# Patient Record
Sex: Male | Born: 1935 | Hispanic: No | Marital: Married | Smoking: Never smoker
Health system: Southern US, Community
[De-identification: ages and names within clinical notes are randomized; demographics above are authoritative.]

## PROBLEM LIST (undated history)

## (undated) DIAGNOSIS — I10 Essential (primary) hypertension: Secondary | ICD-10-CM

## (undated) DIAGNOSIS — E119 Type 2 diabetes mellitus without complications: Secondary | ICD-10-CM

## (undated) DIAGNOSIS — E785 Hyperlipidemia, unspecified: Secondary | ICD-10-CM

## (undated) HISTORY — PX: CARPAL TUNNEL RELEASE: SHX101

---

## 2014-01-16 ENCOUNTER — Encounter: Payer: Self-pay | Admitting: Emergency Medicine

## 2014-01-16 ENCOUNTER — Emergency Department
Admission: EM | Admit: 2014-01-16 | Discharge: 2014-01-16 | Disposition: A | Discharge status: Home / Self Care | Payer: Medicare HMO | Source: Home / Self Care | Attending: Emergency Medicine | Admitting: Emergency Medicine

## 2014-01-16 DIAGNOSIS — K219 Gastro-esophageal reflux disease without esophagitis: Secondary | ICD-10-CM

## 2014-01-16 DIAGNOSIS — R112 Nausea with vomiting, unspecified: Secondary | ICD-10-CM

## 2014-01-16 HISTORY — DX: Essential (primary) hypertension: I10

## 2014-01-16 HISTORY — DX: Type 2 diabetes mellitus without complications: E11.9

## 2014-01-16 HISTORY — DX: Hyperlipidemia, unspecified: E78.5

## 2014-01-16 LAB — POCT CBC W AUTO DIFF (K'VILLE URGENT CARE)

## 2014-01-16 LAB — POCT FASTING CBG KUC MANUAL ENTRY: POCT Glucose (KUC): 176 mg/dL — AB (ref 70–99)

## 2014-01-16 MED ORDER — ONDANSETRON HCL 4 MG PO TABS
4.0000 mg | ORAL_TABLET | Freq: Once | ORAL | Status: AC
  Administered 2014-01-16: 4 mg via ORAL

## 2014-01-16 MED ORDER — OMEPRAZOLE 20 MG PO CPDR: DELAYED_RELEASE_CAPSULE | ORAL | Status: AC

## 2014-01-16 MED ORDER — ONDANSETRON 4 MG PO TBDP: 4.0000 mg | ORAL_TABLET | Freq: Three times a day (TID) | ORAL | Status: AC | PRN

## 2014-01-16 NOTE — ED Notes (Signed)
Mark Harmon c/o vomiting and dizziness x today. Started after lunch today. Vomited twice. Denies abdominal pain.

## 2014-01-16 NOTE — Discharge Instructions (Signed)
Dieta para el reflujo gastroesofgico - Adultos  (Diet for Gastroesophageal Reflux Disease, Adult)  El reflujo se produce cuando el cido del estmago sube hacia el esfago. El esfago se irrita y duele (inflamacin). Cuando hay reflujo frecuente, y es intenso, aparece la enfermedad por reflujo gastroesofgico (ERGE). Consumir algunos alimentos puede ayudar a Crown Holdingscalmar las molestias que produce el Hartford FinancialERGE. ALIMENTOS O BEBIDAS QUE DEBE EVITAR O LIMITAR   Caf, t negro, con o sin cafena  AlbaniaBebidas (gaseosas) que contengan cafena o bebidas energizantes.  Condimentos muy picantes, como pimienta, pimienta de cayena, curry o polvo de chili.  Menta y mentol.  Chocolate.  Alimentos con alto contenido de [place], como carne, frituras, aceite, [place] y nueces.  Nils PyleFrutas y verduras que causen [place]molestias. Se incluyen ctricos y tomates.  Alcohol. Si ciertos alimentos o bebidas le producen ERGE, evtelos.  OTROS FACTORES QUE PUEDEN ALIVIAR EL ERGE SON:   Coma lentamente.  Realice 5 o 6 comidas pequeas por da y no 3 grandes.  No consuma por un tiempo los alimentos que causen problemas.  Espere 3 horas despus de comer para acostarse.  Mantenga la cabecera de la cama elevada entre 6 y 9 pulgadas (15 a -23 centmetros). Use una cua de gomaespuma o coloque bloques debajo de las patas de la cama.  Mantngase fsicamente activo. Bajar de peso, si es necesario, lo ayudar a Altria Groupaliviar las molestias  Use ropas sueltas.  No Fume ni masque tabaco. Document Released: 04/03/2012 [facility] Patient Information 2014 [place], [place].  Today , we gave you a Zofran by mouth, which helped the nausea and vomiting.  Prescriptions we sent to your pharmacy: Zofran Prilosec 20 mg twice a day for reflux  Today, following blood tests were within normal limits: CBC was normal, with a WBC count 8.5 and hemoglobin 14.1 Nonfasting glucose within normal limits at 176  Followup with Dr. Alver FisherBoals in one to 2 weeks, or  sooner if worse or new symptoms.

## 2014-01-16 NOTE — ED Provider Notes (Signed)
CSN: [id number]     Arrival date & time 01/16/14  1425 History   First MD Initiated Contact with Patient 01/16/14 1436     Chief Complaint  Patient presents with  . Emesis  Bookert c/o vomiting and lightheaded x today. Denies vertigo.  Started after lunch today. Vomited twice. Denies abdominal pain. Denies any focal neurologic symptoms. No chest pain or shortness of breath or palpitations. No ENT symptoms. Type 2 diabetes has been controlled, he states A1c is around 7 when he saw his PCP 2 months ago.  No new medications.  Patient is a 78 y.o. male presenting with vomiting. The history is provided by the patient (In his adult daughter).  Emesis Severity:  Moderate Duration:  2 hours Timing: Vomited twice in past 2 hours. Emesis appearance: No blood or bile. Able to tolerate:  Liquids Progression:  Unable to specify Chronicity:  New (Although had similar episode one month ago that resolved after one day.) Recent urination:  Normal Context: not post-tussive and not self-induced   Context comment:  There is some stress, as he is helping to care for his wife with terminal cancer. Relieved by: Zofran 4 mg, given stat upon presentation here to urgent care. Associated symptoms: no abdominal pain, no arthralgias, no chills, no cough, no diarrhea, no fever, no headaches, no myalgias, no sore throat and no URI   Associated symptoms comment:  Occasional reflux symptoms with belching after meals Risk factors: diabetes and suspect food intake (Uncertain, cannot specify)   Risk factors: no alcohol use, no prior abdominal surgery, no sick contacts and no travel to endemic areas       Past Medical History  Diagnosis Date  . Hypertension   . Hyperlipidemia   . Diabetes mellitus without complication     borderline type II   Past Surgical History  Procedure Laterality Date  . Carpal tunnel release     Family History  Problem Relation Age of Onset  . Diabetes Father    History  Substance Use  Topics  . Smoking status: Never Smoker   . Smokeless tobacco: Never Used  . Alcohol Use: No    Review of Systems  Constitutional: Negative for fever and chills.  HENT: Negative.  Negative for sore throat.   Eyes: Negative for visual disturbance.  Respiratory: Negative.  Negative for shortness of breath and wheezing.   Cardiovascular: Negative for chest pain, palpitations and leg swelling.  Gastrointestinal: Positive for nausea and vomiting. Negative for abdominal pain, diarrhea, blood in stool and abdominal distention.       + Occasional reflux symptoms relieved by belching after eating meals  Musculoskeletal: Negative for arthralgias and myalgias.  Skin: Negative for rash.  Neurological: Negative for tremors, seizures, syncope, facial asymmetry, numbness and headaches.       No focal weakness  Psychiatric/Behavioral: Negative for suicidal ideas and hallucinations. The patient is nervous/anxious (Mild, situational).     Allergies  Review of patient's allergies indicates no known allergies.  Home Medications   Current Outpatient Rx  Name  Route  Sig  Dispense  Refill  . atorvastatin (LIPITOR) 20 MG tablet   Oral   Take 20 mg by mouth daily.         Marland Kitchen. BENAZEPRIL HCL PO   Oral   Take by mouth.         Marland Kitchen. GLIMEPIRIDE PO   Oral   Take by mouth.         . Omega-3 Fatty Acids (  FISH OIL PEARLS PO)   Oral   Take by mouth.         Marland Kitchen omeprazole (PRILOSEC) 20 MG capsule      Take one by mouth twice a day   20 capsule   0   . ondansetron (ZOFRAN-ODT) 4 MG disintegrating tablet   Oral   Take 1 tablet (4 mg total) by mouth every 8 (eight) hours as needed for nausea.   15 tablet   0    BP 120/71  Pulse 81  Temp(Src) 97.6 F (36.4 C) (Oral)  Resp 14  Wt 185 lb (83.915 kg)  SpO2 97% Physical Exam  Nursing note and vitals reviewed. Constitutional: He is oriented to person, place, and time. He appears well-developed and well-nourished. No distress.  Alert,  cooperative, no acute distress. Upon presentation to urgent care, he was vomiting. We gave him Zofran ODT 4 mg stat, and nausea and vomiting improved.  HENT:  Head: Normocephalic and atraumatic.  Right Ear: External ear normal.  Left Ear: External ear normal.  Nose: Nose normal.  Mouth/Throat: Oropharynx is clear and moist. No oropharyngeal exudate.  Eyes: Conjunctivae are normal. Right eye exhibits no discharge. Left eye exhibits no discharge. No scleral icterus.  Neck: Normal range of motion. Neck supple. No JVD present. No tracheal deviation present.  Cardiovascular: Normal rate, regular rhythm and normal heart sounds.   No murmur heard. Pulmonary/Chest: Effort normal and breath sounds normal. No respiratory distress. He has no wheezes. He has no rales.  Abdominal: Soft. Bowel sounds are normal. He exhibits no distension, no abdominal bruit, no pulsatile midline mass and no mass. There is no hepatosplenomegaly. There is no tenderness. There is no rigidity, no rebound, no guarding, no CVA tenderness, no tenderness at McBurney's point and negative Murphy's sign.  Musculoskeletal: Normal range of motion.  Lymphadenopathy:    He has no cervical adenopathy.  Neurological: He is alert and oriented to person, place, and time. He has normal strength. He displays no tremor. No cranial nerve deficit or sensory deficit. He exhibits normal muscle tone. He displays a negative Romberg sign. Coordination and gait normal.  Skin: Skin is warm and dry. No rash noted. He is not diaphoretic.  Psychiatric: He has a normal mood and affect.   Vital signs show No orthostatic changes ED Course  Procedures (including critical care time) Labs Review Labs Reviewed  POCT CBC W AUTO DIFF ([facility])   Imaging Review No results found.  Results for orders placed during the hospital encounter of 01/16/14  POCT CBC W AUTO DIFF ([facility])      Result Value Ref Range   WBC    4.5 - 10.5 K/uL    Lymphocytes relative %    15 - 45 %   Monocytes relative %    2 - 10 %   Neutrophils relative % (GR)    44 - 76 %   Lymphocytes absolute    0.1 - 1.8 K/uL   Monocyes absolute    0.1 - 1 K/uL   Neutrophils absolute (GR#)    1.7 - 7.7 K/uL   RBC    4.2 - 5.8 MIL/uL   Hemoglobin    13 - 17 g/dL   Hematocrit    45.4 - 51 %   MCV    80 - 98 fL   MCH    26.5 - 32.5 pg   MCHC    32.5 - 36.9 g/dL   RDW  11.6 - 14 %   Platelet count    140 - 400 K/uL   MPV    7.8 - 11 fL   Nonfasting glucose 176 CBC within normal limits. WBC count normal. Hemoglobin normal MDM   1. Nausea with vomiting   2. Gastro-esophageal reflux    No evidence of any acute cardiorespiratory or neurologic event. Treatment options discussed, as well as risks, benefits, alternatives. Patient and daughter voiced understanding and agreement with the following plans: Zofran 4 mg by mouth stat here in urgent care significantly helped his nausea and vomiting resolved. Discussed with patient and daughter AVS printed and given to patient and daughter call "Prescriptions we sent to your pharmacy: Zofran Prilosec 20 mg twice a day for reflux  Today, following blood tests were within normal limits: CBC was normal, with a WBC count 8.5 and hemoglobin 14.1 Nonfasting glucose within normal limits at 176  Followup with Dr. Alver Fisher in one to 2 weeks, or sooner if worse or new symptoms."  Anti-reflex measures discussed, and handout given.-See AVS Precautions discussed. Red flags discussed. Questions invited and answered. They voiced understanding and agreement.   Lajean Manes, MD 01/16/14 [phone]

## 2014-01-17 ENCOUNTER — Telehealth: Payer: Self-pay | Admitting: Emergency Medicine

## 2014-08-20 ENCOUNTER — Encounter: Payer: Self-pay | Admitting: *Deleted

## 2014-08-20 ENCOUNTER — Emergency Department (INDEPENDENT_AMBULATORY_CARE_PROVIDER_SITE_OTHER): Payer: Medicare HMO

## 2014-08-20 ENCOUNTER — Emergency Department
Admission: EM | Admit: 2014-08-20 | Discharge: 2014-08-20 | Disposition: A | Discharge status: Home / Self Care | Payer: Medicare HMO | Source: Home / Self Care | Attending: Emergency Medicine | Admitting: Emergency Medicine

## 2014-08-20 DIAGNOSIS — W19XXXA Unspecified fall, initial encounter: Secondary | ICD-10-CM

## 2014-08-20 DIAGNOSIS — S82402A Unspecified fracture of shaft of left fibula, initial encounter for closed fracture: Secondary | ICD-10-CM | POA: Diagnosis not present

## 2014-08-20 DIAGNOSIS — M7732 Calcaneal spur, left foot: Secondary | ICD-10-CM

## 2014-08-20 DIAGNOSIS — S82832A Other fracture of upper and lower end of left fibula, initial encounter for closed fracture: Secondary | ICD-10-CM

## 2014-08-20 MED ORDER — HYDROCODONE-ACETAMINOPHEN 5-325 MG PO TABS: 1.0000 | ORAL_TABLET | ORAL | Status: AC | PRN

## 2014-08-20 NOTE — ED Provider Notes (Signed)
CSN: [id number]     Arrival date & time 08/20/14  1711 History   First MD Initiated Contact with Patient 08/20/14 1724     Chief Complaint  Patient presents with  . Foot Pain   Here with his adult daughter who brings him in.  HPI Pt c/o LT ankle pain x 0230 today post fall at home. He reports he slipped outside. He took 2 Advil.  The pain is sharp, and dull, and moderate to severe, and is unable to weight-bearing. Denies syncope or focal neurologic symptoms. No chest pain or shortness of breath. Past Medical History  Diagnosis Date  . Hypertension   . Hyperlipidemia   . Diabetes mellitus without complication     borderline type II   Past Surgical History  Procedure Laterality Date  . Carpal tunnel release     Family History  Problem Relation Age of Onset  . Diabetes Father    History  Substance Use Topics  . Smoking status: Never Smoker   . Smokeless tobacco: Never Used  . Alcohol Use: No    Review of Systems  All other systems reviewed and are negative.   Allergies  Review of patient's allergies indicates no known allergies.  Home Medications   Prior to Admission medications   Medication Sig Start Date End Date Taking? Authorizing Provider  atorvastatin (LIPITOR) 20 MG tablet Take 20 mg by mouth daily.    Historical Provider, MD  BENAZEPRIL HCL PO Take by mouth.    Historical Provider, MD  GLIMEPIRIDE PO Take by mouth.    Historical Provider, MD  HYDROcodone-acetaminophen (NORCO/VICODIN) 5-325 MG per tablet Take 1-2 tablets by mouth every 4 (four) hours as needed for severe pain. Take with food. 08/20/14   Lajean Manesavid Massey, MD  Omega-3 Fatty Acids (FISH OIL PEARLS PO) Take by mouth.    Historical Provider, MD  omeprazole (PRILOSEC) 20 MG capsule Take one by mouth twice a day 01/16/14   Lajean Manesavid Massey, MD  ondansetron (ZOFRAN-ODT) 4 MG disintegrating tablet Take 1 tablet (4 mg total) by mouth every 8 (eight) hours as needed for nausea. 01/16/14   Lajean Manesavid Massey, MD   BP 135/65  mmHg  Pulse 82  Temp(Src) 98.2 F (36.8 C) (Oral)  Resp 16  Ht 5\' 10"  (1.778 m)  Wt 180 lb (81.647 kg)  BMI 25.83 kg/m2  SpO2 98% Physical Exam  Constitutional: He is oriented to person, place, and time. He appears well-developed and well-nourished. No distress.  HENT:  Head: Normocephalic and atraumatic.  Eyes: Conjunctivae and EOM are normal. Pupils are equal, round, and reactive to light. No scleral icterus.  Neck: Normal range of motion.  Cardiovascular: Normal rate.   Pulmonary/Chest: Effort normal.  Abdominal: He exhibits no distension.  Musculoskeletal:       Right ankle: He exhibits decreased range of motion, swelling and ecchymosis. He exhibits no laceration and normal pulse. Tenderness. Lateral malleolus tenderness found. No head of 5th metatarsal tenderness found. Achilles tendon normal.       Feet:  4+ swelling and tenderness lateral malleolus and distal fibula.  Neurological: He is alert and oriented to person, place, and time.  Skin: Skin is warm.  Psychiatric: He has a normal mood and affect.  Nursing note and vitals reviewed.  Neurovascular distally intact ED Course  Procedures (including critical care time) Labs Review Labs Reviewed - No data to display  Imaging Review Dg Ankle Complete Left  08/20/2014   CLINICAL DATA:  Fall.  Twisted left  ankle.  EXAM: LEFT ANKLE COMPLETE - 3+ VIEW  COMPARISON:  None  FINDINGS: There is diffuse soft tissue swelling. Intra-articular fracture involving the distal fibula is identified. Suspicious lucency extends through the posterior malleolus. Large plantar and posterior calcaneal heel spurs identified.  IMPRESSION: 1. Distal fibular fracture. 2. Cannot rule out nondisplaced posterior malleolar fracture. 3. Heel spurs.   Electronically Signed   By: Signa Kellaylor  Stroud M.D.   On: 08/20/2014 17:54     MDM   1. Fracture of left fibula, closed, initial encounter   2. Fall     Discussed at length with daughter ACE  applied Encourage rest, ice, compression with ACE bandage, and elevation of injured body part. Cam walker left ankle/leg,--they declined crutches, as he has a movable seat/wheelchair at home for now Vicodin as needed for severe pain--precautions discussed with patient and daughter. HYDROcodone-acetaminophen (NORCO/VICODIN) 5-325 MG per tablet Take 1-2 tablets by mouth every 4 (four) hours as needed for severe pain. Take with food. 12 tablet    They prefer to follow-up at [facility], advised to call for appointment tomorrow to be seen within 2-3 days. We made a copy of x-rays on a disc, for daughter to bring to the orthopedic appointment.  Red flags discussed.---emergency room with any red flag Questions invited and answered. they voiced understanding and agreement.    Lajean Manesavid Massey, MD 08/22/14 [phone]

## 2014-08-20 NOTE — ED Notes (Signed)
Pt c/o LT ankle pain x 0230 today post fall at home. He reports he slipped outside. He took 2 Advil.

## 2015-03-13 IMAGING — CR DG ANKLE COMPLETE 3+V*L*
3 series · 3 of 3 positions shown · non-contrast
Comparison: None

CLINICAL DATA: Fall.  Twisted left ankle.

EXAM:
LEFT ANKLE COMPLETE - 3+ VIEW

[view not recorded (1 of 3)]
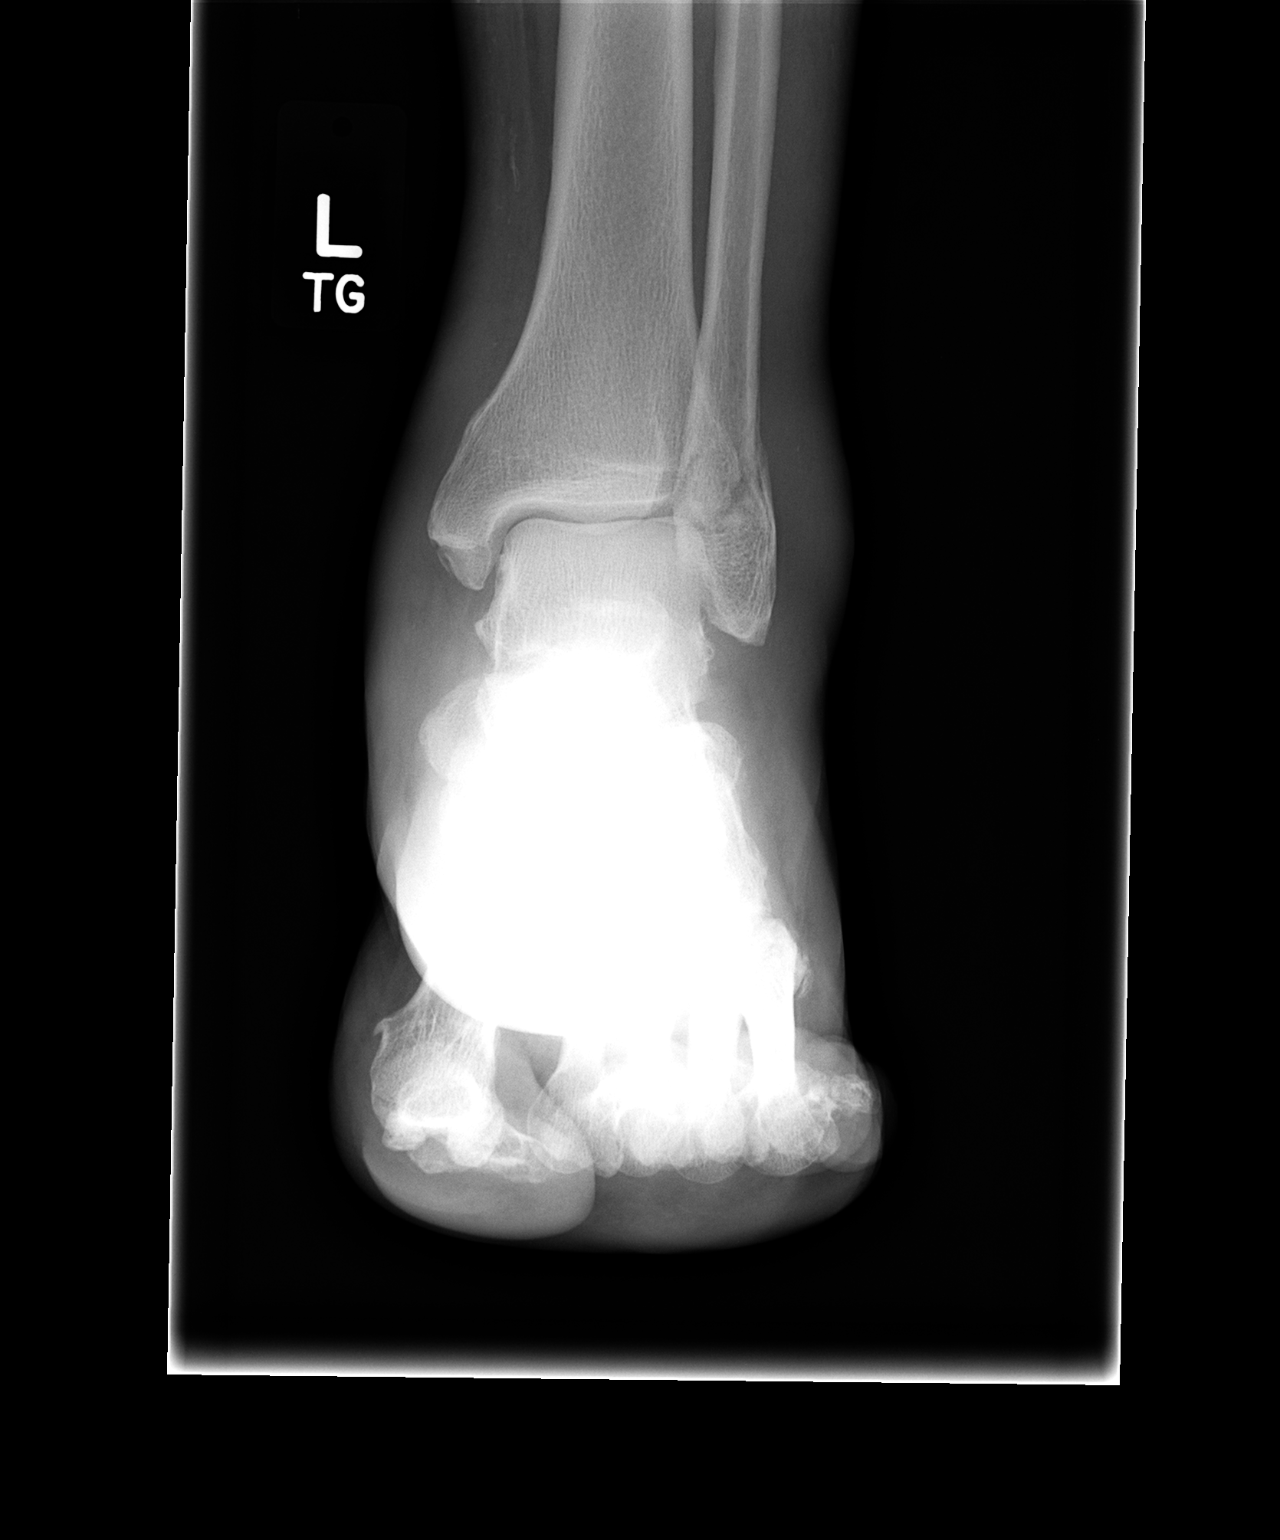

[view not recorded (2 of 3)]
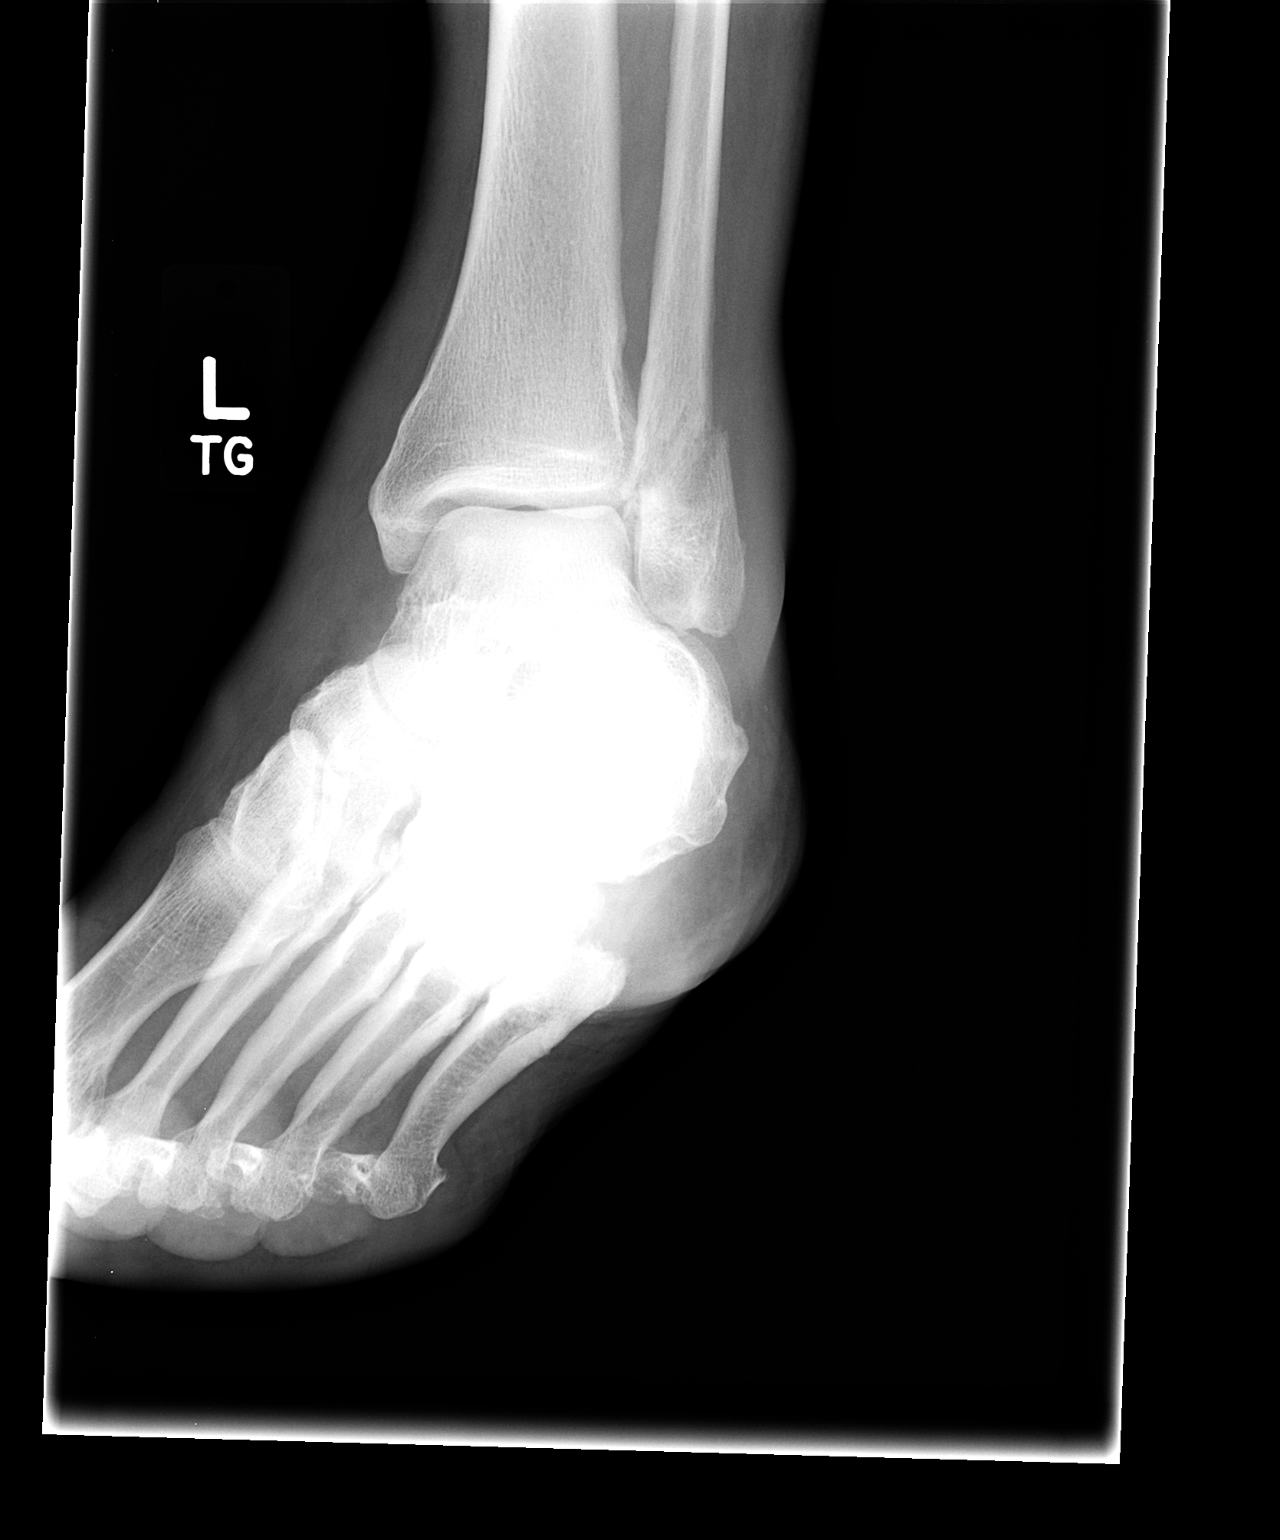

[view not recorded (3 of 3)]
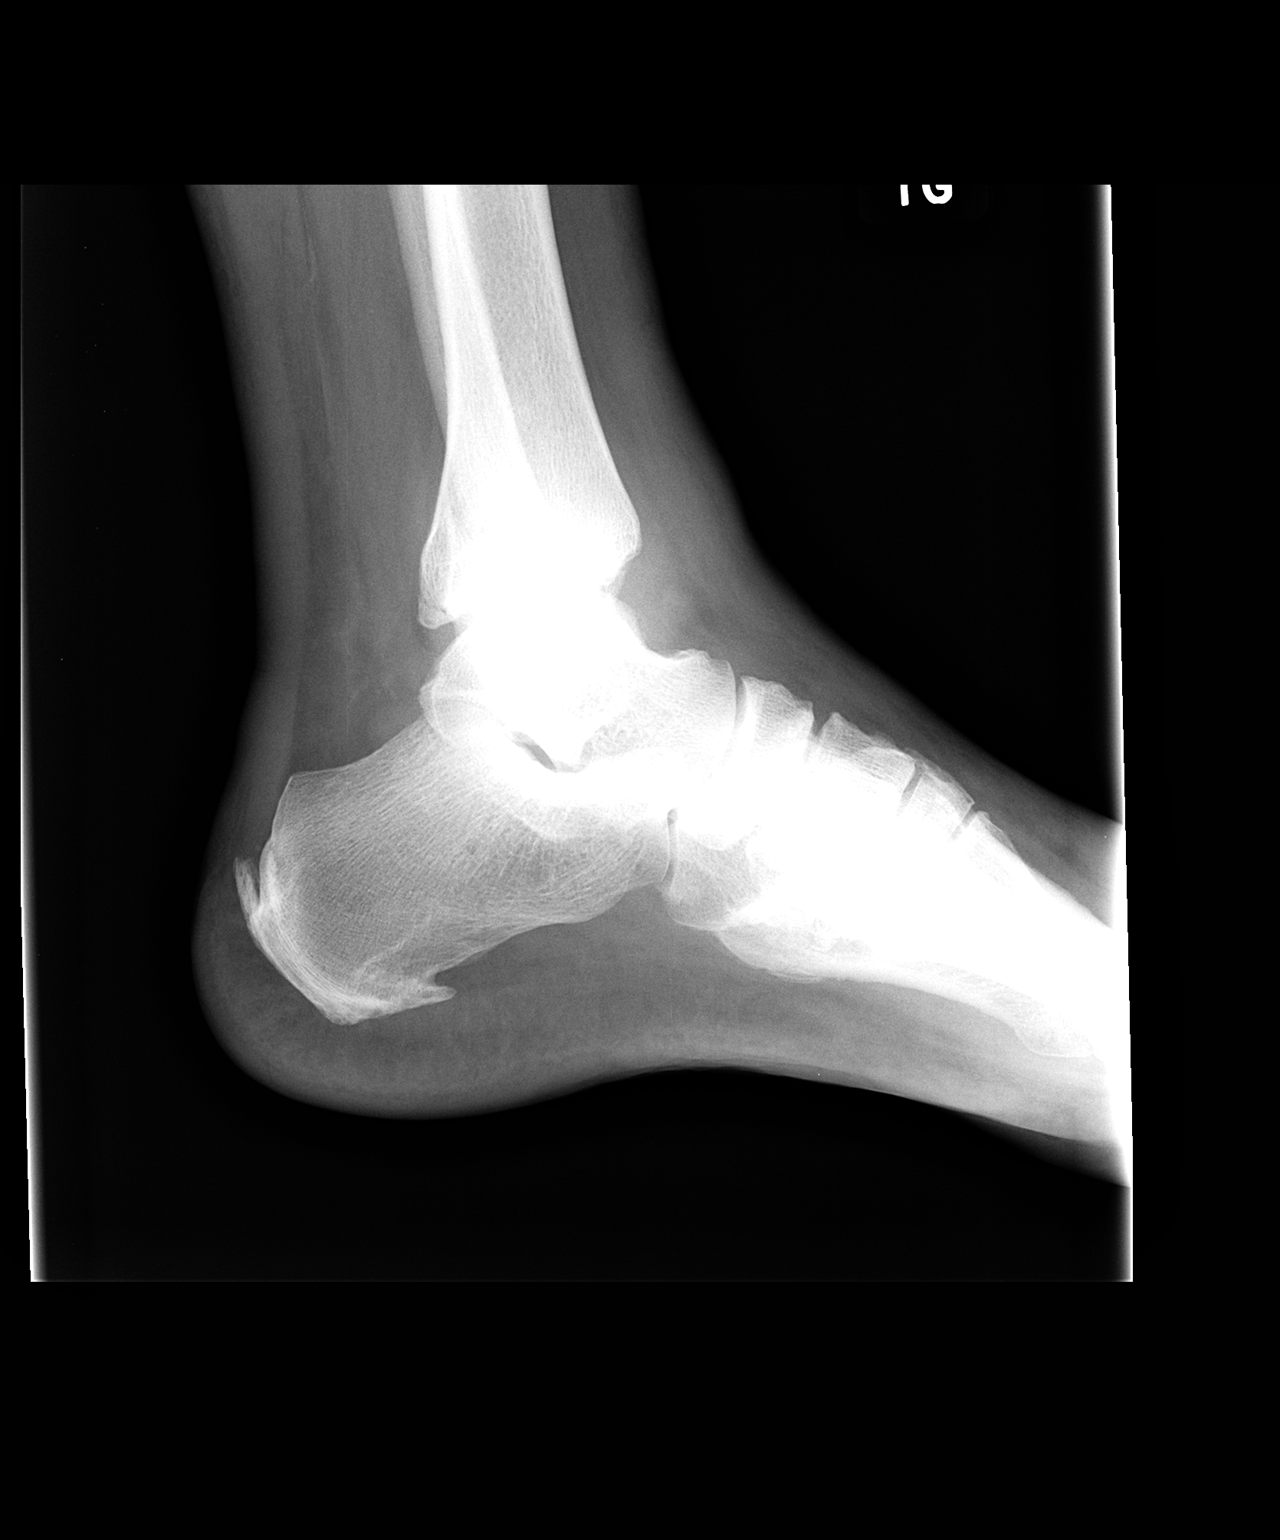

[3 of 3 positions shown; findings below may reference images not displayed]

FINDINGS: There is diffuse soft tissue swelling. Intra-articular fracture
involving the distal fibula is identified. Suspicious lucency
extends through the posterior malleolus. Large plantar and posterior
calcaneal heel spurs identified.
IMPRESSION: 1. Distal fibular fracture.
2. Cannot rule out nondisplaced posterior malleolar fracture.
3. Heel spurs.
